# Patient Record
Sex: Male | Born: 1975 | Race: White | Hispanic: No | Marital: Married | Smoking: Never smoker
Health system: Southern US, Community
[De-identification: ages and names within clinical notes are randomized; demographics above are authoritative.]

## PROBLEM LIST (undated history)

## (undated) DIAGNOSIS — M109 Gout, unspecified: Secondary | ICD-10-CM

## (undated) HISTORY — PX: SMALL INTESTINE SURGERY: SHX150

## (undated) HISTORY — PX: HAND SURGERY: SHX662

---

## 1999-10-17 ENCOUNTER — Emergency Department (HOSPITAL_COMMUNITY): Admission: EM | Admit: 1999-10-17 | Discharge: 1999-10-17 | Payer: Self-pay | Admitting: Emergency Medicine

## 1999-11-20 ENCOUNTER — Emergency Department (HOSPITAL_COMMUNITY): Admission: EM | Admit: 1999-11-20 | Discharge: 1999-11-20 | Payer: Self-pay | Admitting: Emergency Medicine

## 1999-12-22 ENCOUNTER — Encounter: Payer: Self-pay | Admitting: Emergency Medicine

## 1999-12-22 ENCOUNTER — Emergency Department (HOSPITAL_COMMUNITY): Admission: EM | Admit: 1999-12-22 | Discharge: 1999-12-22 | Payer: Self-pay | Admitting: Emergency Medicine

## 2000-04-14 ENCOUNTER — Emergency Department (HOSPITAL_COMMUNITY): Admission: EM | Admit: 2000-04-14 | Discharge: 2000-04-14 | Payer: Self-pay | Admitting: Emergency Medicine

## 2003-01-22 ENCOUNTER — Emergency Department (HOSPITAL_COMMUNITY): Admission: EM | Admit: 2003-01-22 | Discharge: 2003-01-22 | Payer: Self-pay | Admitting: Emergency Medicine

## 2003-01-22 ENCOUNTER — Encounter: Payer: Self-pay | Admitting: Emergency Medicine

## 2014-07-20 ENCOUNTER — Emergency Department (HOSPITAL_COMMUNITY)
Admission: EM | Admit: 2014-07-20 | Discharge: 2014-07-20 | Disposition: A | Discharge status: Home / Self Care | Payer: No Typology Code available for payment source | Attending: Emergency Medicine | Admitting: Emergency Medicine

## 2014-07-20 ENCOUNTER — Emergency Department (HOSPITAL_COMMUNITY): Payer: No Typology Code available for payment source

## 2014-07-20 ENCOUNTER — Encounter (HOSPITAL_COMMUNITY): Payer: Self-pay | Admitting: *Deleted

## 2014-07-20 DIAGNOSIS — S300XXA Contusion of lower back and pelvis, initial encounter: Secondary | ICD-10-CM | POA: Diagnosis not present

## 2014-07-20 DIAGNOSIS — Z8739 Personal history of other diseases of the musculoskeletal system and connective tissue: Secondary | ICD-10-CM | POA: Insufficient documentation

## 2014-07-20 DIAGNOSIS — S301XXA Contusion of abdominal wall, initial encounter: Secondary | ICD-10-CM | POA: Diagnosis not present

## 2014-07-20 DIAGNOSIS — S8991XA Unspecified injury of right lower leg, initial encounter: Secondary | ICD-10-CM

## 2014-07-20 DIAGNOSIS — S3992XA Unspecified injury of lower back, initial encounter: Secondary | ICD-10-CM | POA: Diagnosis present

## 2014-07-20 DIAGNOSIS — Y92410 Unspecified street and highway as the place of occurrence of the external cause: Secondary | ICD-10-CM | POA: Insufficient documentation

## 2014-07-20 DIAGNOSIS — Y9389 Activity, other specified: Secondary | ICD-10-CM | POA: Insufficient documentation

## 2014-07-20 DIAGNOSIS — Y998 Other external cause status: Secondary | ICD-10-CM | POA: Diagnosis not present

## 2014-07-20 DIAGNOSIS — S8992XA Unspecified injury of left lower leg, initial encounter: Secondary | ICD-10-CM | POA: Diagnosis not present

## 2014-07-20 HISTORY — DX: Gout, unspecified: M10.9

## 2014-07-20 LAB — ETHANOL

## 2014-07-20 LAB — CBC
HCT: 44.1 % (ref 39.0–52.0)
HEMOGLOBIN: 15.8 g/dL (ref 13.0–17.0)
MCH: 29.4 pg (ref 26.0–34.0)
MCHC: 35.8 g/dL (ref 30.0–36.0)
MCV: 82.1 fL (ref 78.0–100.0)
Platelets: 247 10*3/uL (ref 150–400)
RBC: 5.37 MIL/uL (ref 4.22–5.81)
RDW: 13.7 % (ref 11.5–15.5)
WBC: 6.8 10*3/uL (ref 4.0–10.5)

## 2014-07-20 LAB — COMPREHENSIVE METABOLIC PANEL
ALBUMIN: 4.3 g/dL (ref 3.5–5.2)
ALT: 26 U/L (ref 0–53)
AST: 31 U/L (ref 0–37)
Alkaline Phosphatase: 57 U/L (ref 39–117)
Anion gap: 6 (ref 5–15)
BUN: 12 mg/dL (ref 6–23)
CHLORIDE: 102 mmol/L (ref 96–112)
CO2: 28 mmol/L (ref 19–32)
CREATININE: 1.26 mg/dL (ref 0.50–1.35)
Calcium: 9.7 mg/dL (ref 8.4–10.5)
GFR calc Af Amer: 83 mL/min — ABNORMAL LOW (ref >90)
GFR calc non Af Amer: 72 mL/min — ABNORMAL LOW (ref >90)
GLUCOSE: 92 mg/dL (ref 70–99)
POTASSIUM: 4.2 mmol/L (ref 3.5–5.1)
SODIUM: 136 mmol/L (ref 135–145)
TOTAL PROTEIN: 7.4 g/dL (ref 6.0–8.3)
Total Bilirubin: 0.6 mg/dL (ref 0.3–1.2)

## 2014-07-20 LAB — PROTIME-INR
INR: 0.99 (ref 0.00–1.49)
Prothrombin Time: 13.2 seconds (ref 11.6–15.2)

## 2014-07-20 LAB — SAMPLE TO BLOOD BANK

## 2014-07-20 LAB — CDS SEROLOGY

## 2014-07-20 MED ORDER — SODIUM CHLORIDE 0.9 % IV BOLUS (SEPSIS)
1000.0000 mL | Freq: Once | INTRAVENOUS | Status: AC
  Administered 2014-07-20: 1000 mL via INTRAVENOUS

## 2014-07-20 MED ORDER — IBUPROFEN 800 MG PO TABS: 800.0000 mg | ORAL_TABLET | Freq: Three times a day (TID) | ORAL | Status: AC

## 2014-07-20 MED ORDER — IOHEXOL 300 MG/ML  SOLN
100.0000 mL | Freq: Once | INTRAMUSCULAR | Status: AC | PRN
  Administered 2014-07-20: 100 mL via INTRAVENOUS

## 2014-07-20 MED ORDER — FENTANYL CITRATE 0.05 MG/ML IJ SOLN
50.0000 ug | Freq: Once | INTRAMUSCULAR | Status: AC
  Administered 2014-07-20: 50 ug via INTRAVENOUS
  Filled 2014-07-20: qty 2

## 2014-07-20 MED ORDER — HYDROMORPHONE HCL 1 MG/ML IJ SOLN
1.0000 mg | Freq: Once | INTRAMUSCULAR | Status: AC
  Administered 2014-07-20: 1 mg via INTRAVENOUS
  Filled 2014-07-20: qty 1

## 2014-07-20 MED ORDER — ONDANSETRON HCL 4 MG/2ML IJ SOLN
4.0000 mg | Freq: Once | INTRAMUSCULAR | Status: AC
  Administered 2014-07-20: 4 mg via INTRAVENOUS
  Filled 2014-07-20: qty 2

## 2014-07-20 MED ORDER — OXYCODONE-ACETAMINOPHEN 5-325 MG PO TABS: 1.0000 | ORAL_TABLET | Freq: Four times a day (QID) | ORAL | Status: AC | PRN
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
## 2014-07-20 NOTE — ED Notes (Signed)
Dr. Silverio LayYao stated that he doesn't need the urine.

## 2014-07-20 NOTE — ED Notes (Signed)
Pt gone to CT 
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
## 2014-07-20 NOTE — ED Notes (Signed)
NAD at this time. Pt is stable and going home.  

## 2014-07-20 NOTE — Discharge Instructions (Signed)
Take motrin for pain.   Take percocet for severe pain.  You will be stiff and sore tomorrow  Rest for 2 days,.   Follow up with your doctor.   Return to ER if you have severe pain, vomiting, headaches, weakness.
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
